# Patient Record
Sex: Female | Born: 1955 | Race: White | Hispanic: No | Marital: Single | Smoking: Former smoker
Health system: Southern US, Community
[De-identification: ages and names within clinical notes are randomized; demographics above are authoritative.]

## PROBLEM LIST (undated history)

## (undated) DIAGNOSIS — G44009 Cluster headache syndrome, unspecified, not intractable: Secondary | ICD-10-CM

## (undated) DIAGNOSIS — E785 Hyperlipidemia, unspecified: Secondary | ICD-10-CM

## (undated) HISTORY — DX: Cluster headache syndrome, unspecified, not intractable: G44.009

## (undated) HISTORY — PX: OTHER SURGICAL HISTORY: SHX169

---

## 2001-11-28 ENCOUNTER — Encounter: Admission: RE | Admit: 2001-11-28 | Discharge: 2001-11-28 | Payer: Self-pay | Admitting: Family Medicine

## 2001-11-28 ENCOUNTER — Encounter: Payer: Self-pay | Admitting: Family Medicine

## 2002-12-06 ENCOUNTER — Encounter: Payer: Self-pay | Admitting: Family Medicine

## 2002-12-06 ENCOUNTER — Encounter: Admission: RE | Admit: 2002-12-06 | Discharge: 2002-12-06 | Payer: Self-pay | Admitting: Family Medicine

## 2004-02-27 ENCOUNTER — Encounter: Admission: RE | Admit: 2004-02-27 | Discharge: 2004-02-27 | Payer: Self-pay | Admitting: Family Medicine

## 2004-09-18 ENCOUNTER — Other Ambulatory Visit: Admission: RE | Admit: 2004-09-18 | Discharge: 2004-09-18 | Payer: Self-pay | Admitting: Family Medicine

## 2005-04-06 HISTORY — PX: ABDOMINAL HYSTERECTOMY: SHX81

## 2005-04-08 ENCOUNTER — Encounter: Admission: RE | Admit: 2005-04-08 | Discharge: 2005-04-08 | Payer: Self-pay | Admitting: Family Medicine

## 2006-04-20 ENCOUNTER — Encounter: Admission: RE | Admit: 2006-04-20 | Discharge: 2006-04-20 | Payer: Self-pay | Admitting: Family Medicine

## 2007-04-22 ENCOUNTER — Encounter: Admission: RE | Admit: 2007-04-22 | Discharge: 2007-04-22 | Payer: Self-pay | Admitting: Family Medicine

## 2008-05-04 ENCOUNTER — Encounter: Admission: RE | Admit: 2008-05-04 | Discharge: 2008-05-04 | Payer: Self-pay | Admitting: Family Medicine

## 2009-06-07 ENCOUNTER — Encounter: Admission: RE | Admit: 2009-06-07 | Discharge: 2009-06-07 | Payer: Self-pay | Admitting: Family Medicine

## 2009-10-23 ENCOUNTER — Encounter: Admission: RE | Admit: 2009-10-23 | Discharge: 2009-10-23 | Payer: Self-pay | Admitting: Family Medicine

## 2009-12-04 ENCOUNTER — Encounter: Admission: RE | Admit: 2009-12-04 | Discharge: 2009-12-04 | Payer: Self-pay | Admitting: Family Medicine

## 2010-05-05 ENCOUNTER — Other Ambulatory Visit: Payer: Self-pay | Admitting: Family Medicine

## 2010-05-05 DIAGNOSIS — Z1239 Encounter for other screening for malignant neoplasm of breast: Secondary | ICD-10-CM

## 2010-06-13 ENCOUNTER — Ambulatory Visit
Admission: RE | Admit: 2010-06-13 | Discharge: 2010-06-13 | Disposition: A | Discharge status: Home / Self Care | Payer: BC Managed Care – PPO | Source: Ambulatory Visit | Attending: Family Medicine | Admitting: Family Medicine

## 2010-06-13 DIAGNOSIS — Z1239 Encounter for other screening for malignant neoplasm of breast: Secondary | ICD-10-CM

## 2011-03-26 ENCOUNTER — Emergency Department
Admission: EM | Admit: 2011-03-26 | Discharge: 2011-03-26 | Disposition: A | Discharge status: Home / Self Care | Payer: BC Managed Care – PPO | Source: Home / Self Care | Attending: Emergency Medicine | Admitting: Emergency Medicine

## 2011-03-26 ENCOUNTER — Encounter: Payer: Self-pay | Admitting: Emergency Medicine

## 2011-03-26 DIAGNOSIS — H9209 Otalgia, unspecified ear: Secondary | ICD-10-CM

## 2011-03-26 DIAGNOSIS — H6092 Unspecified otitis externa, left ear: Secondary | ICD-10-CM

## 2011-03-26 DIAGNOSIS — H9202 Otalgia, left ear: Secondary | ICD-10-CM

## 2011-03-26 DIAGNOSIS — H60399 Other infective otitis externa, unspecified ear: Secondary | ICD-10-CM

## 2011-03-26 MED ORDER — NEOMYCIN-POLYMYXIN-HC 3.5-10000-1 OT SUSP: 4.0000 [drp] | Freq: Three times a day (TID) | OTIC | Status: AC

## 2011-03-26 NOTE — ED Provider Notes (Signed)
History     CSN: [id number]  Arrival date & time 03/26/11  4098   First MD Initiated Contact with Patient 03/26/11 [phone]      Chief Complaint  Patient presents with  . Otalgia    (Consider location/radiation/quality/duration/timing/severity/associated sxs/prior treatment) HPI She is reporting left ear pain for the last 3 days. She is trying to get ear wax there feels that she is irritated the left ear canal. No fever, chills, or any other constitutional symptoms. She does not have any history of ear infections. She is flying in a few days and is concerned that she may have an ear infection.  History reviewed. No pertinent past medical history.  No past surgical history on file.  No family history on file.  History  Substance Use Topics  . Smoking status: Not on file  . Smokeless tobacco: Not on file  . Alcohol Use: Not on file    OB History    Grav Para Term Preterm Abortions TAB SAB Ect Mult Living                  Review of Systems  Allergies  Review of patient's allergies indicates no known allergies.  Home Medications   Current Outpatient Rx  Name Route Sig Dispense Refill  . ATORVASTATIN CALCIUM 20 MG PO TABS Oral Take 20 mg by mouth daily.        BP 137/87  Pulse 79  Temp(Src) 98.2 F (36.8 C) (Oral)  Resp 16  Ht 5\' 2"  (1.575 m)  Wt 140 lb (63.504 kg)  BMI 25.61 kg/m2  SpO2 98%  Physical Exam  Nursing note and vitals reviewed. Constitutional: She is oriented to person, place, and time. She appears well-developed and well-nourished.  HENT:  Head: Normocephalic and atraumatic.  Right Ear: Tympanic membrane, external ear and ear canal normal.  Left Ear: Tympanic membrane and external ear normal.       In the left ear canal there is some irritation posteriorly, mild cerumen, mild swelling.  Eyes: No scleral icterus.  Neck: Neck supple.  Cardiovascular: Regular rhythm and normal heart sounds.   Pulmonary/Chest: Effort normal and breath sounds  normal. No respiratory distress.  Neurological: She is alert and oriented to person, place, and time.  Skin: Skin is warm and dry.  Psychiatric: She has a normal mood and affect. Her speech is normal.    ED Course  Procedures (including critical care time)  Labs Reviewed - No data to display No results found.   No diagnosis found.    MDM   Left otitis externa with some irritation and ear canal. We'll give a prescription for antibiotic eardrops. She is flying in a few days since she can take some Sudafed prior to flying to maintain eustachian tube function. Can also take some ibuprofen or Tylenol for the discomfort. If not improving followup with ENT or her primary care physician.   Lily Kocher, MD 03/26/11 [phone]

## 2011-03-26 NOTE — ED Notes (Signed)
Left ear pain x 3 days.   

## 2011-05-05 ENCOUNTER — Other Ambulatory Visit: Payer: Self-pay | Admitting: Family Medicine

## 2011-05-05 DIAGNOSIS — Z1231 Encounter for screening mammogram for malignant neoplasm of breast: Secondary | ICD-10-CM

## 2011-06-18 ENCOUNTER — Ambulatory Visit
Admission: RE | Admit: 2011-06-18 | Discharge: 2011-06-18 | Disposition: A | Discharge status: Home / Self Care | Payer: BC Managed Care – PPO | Source: Ambulatory Visit | Attending: Family Medicine | Admitting: Family Medicine

## 2011-06-18 DIAGNOSIS — Z1231 Encounter for screening mammogram for malignant neoplasm of breast: Secondary | ICD-10-CM

## 2012-05-24 ENCOUNTER — Other Ambulatory Visit: Payer: Self-pay | Admitting: Family Medicine

## 2012-05-24 DIAGNOSIS — Z1231 Encounter for screening mammogram for malignant neoplasm of breast: Secondary | ICD-10-CM

## 2012-06-24 ENCOUNTER — Ambulatory Visit
Admission: RE | Admit: 2012-06-24 | Discharge: 2012-06-24 | Disposition: A | Discharge status: Home / Self Care | Payer: BC Managed Care – PPO | Source: Ambulatory Visit | Attending: Family Medicine | Admitting: Family Medicine

## 2012-06-24 DIAGNOSIS — Z1231 Encounter for screening mammogram for malignant neoplasm of breast: Secondary | ICD-10-CM

## 2013-06-07 ENCOUNTER — Other Ambulatory Visit: Payer: Self-pay

## 2013-06-07 DIAGNOSIS — Z1231 Encounter for screening mammogram for malignant neoplasm of breast: Secondary | ICD-10-CM

## 2013-07-06 ENCOUNTER — Ambulatory Visit
Admission: RE | Admit: 2013-07-06 | Discharge: 2013-07-06 | Disposition: A | Discharge status: Home / Self Care | Payer: BC Managed Care – PPO | Source: Ambulatory Visit

## 2013-07-06 DIAGNOSIS — Z1231 Encounter for screening mammogram for malignant neoplasm of breast: Secondary | ICD-10-CM

## 2014-07-09 ENCOUNTER — Other Ambulatory Visit: Payer: Self-pay

## 2014-07-09 DIAGNOSIS — Z1231 Encounter for screening mammogram for malignant neoplasm of breast: Secondary | ICD-10-CM

## 2014-07-20 ENCOUNTER — Ambulatory Visit: Payer: BC Managed Care – PPO

## 2014-07-27 ENCOUNTER — Ambulatory Visit
Admission: RE | Admit: 2014-07-27 | Discharge: 2014-07-27 | Disposition: A | Discharge status: Home / Self Care | Payer: BC Managed Care – PPO | Source: Ambulatory Visit

## 2014-07-27 DIAGNOSIS — Z1231 Encounter for screening mammogram for malignant neoplasm of breast: Secondary | ICD-10-CM

## 2014-09-18 ENCOUNTER — Other Ambulatory Visit: Payer: Self-pay | Admitting: Family Medicine

## 2014-09-18 ENCOUNTER — Ambulatory Visit
Admission: RE | Admit: 2014-09-18 | Discharge: 2014-09-18 | Disposition: A | Discharge status: Home / Self Care | Payer: BC Managed Care – PPO | Source: Ambulatory Visit | Attending: Family Medicine | Admitting: Family Medicine

## 2014-09-18 DIAGNOSIS — W19XXXA Unspecified fall, initial encounter: Secondary | ICD-10-CM

## 2014-09-18 DIAGNOSIS — R0781 Pleurodynia: Secondary | ICD-10-CM

## 2015-06-15 ENCOUNTER — Emergency Department
Admission: EM | Admit: 2015-06-15 | Discharge: 2015-06-15 | Disposition: A | Discharge status: Home / Self Care | Payer: BC Managed Care – PPO | Source: Home / Self Care | Attending: Family Medicine | Admitting: Family Medicine

## 2015-06-15 ENCOUNTER — Emergency Department (INDEPENDENT_AMBULATORY_CARE_PROVIDER_SITE_OTHER): Payer: BC Managed Care – PPO

## 2015-06-15 ENCOUNTER — Encounter: Payer: Self-pay | Admitting: Emergency Medicine

## 2015-06-15 DIAGNOSIS — S61412A Laceration without foreign body of left hand, initial encounter: Secondary | ICD-10-CM

## 2015-06-15 DIAGNOSIS — Z23 Encounter for immunization: Secondary | ICD-10-CM | POA: Diagnosis not present

## 2015-06-15 DIAGNOSIS — R030 Elevated blood-pressure reading, without diagnosis of hypertension: Secondary | ICD-10-CM | POA: Diagnosis not present

## 2015-06-15 DIAGNOSIS — X58XXXA Exposure to other specified factors, initial encounter: Secondary | ICD-10-CM

## 2015-06-15 DIAGNOSIS — IMO0001 Reserved for inherently not codable concepts without codable children: Secondary | ICD-10-CM

## 2015-06-15 HISTORY — DX: Hyperlipidemia, unspecified: E78.5

## 2015-06-15 MED ORDER — TETANUS-DIPHTH-ACELL PERTUSSIS 5-2.5-18.5 LF-MCG/0.5 IM SUSP
0.5000 mL | Freq: Once | INTRAMUSCULAR | Status: AC
  Administered 2015-06-15: 0.5 mL via INTRAMUSCULAR

## 2015-06-15 MED ORDER — TRAMADOL HCL 50 MG PO TABS
50.0000 mg | ORAL_TABLET | Freq: Four times a day (QID) | ORAL | Status: DC | PRN
Start: 2015-06-15 — End: 2015-12-20

## 2015-06-15 MED ORDER — CEPHALEXIN 500 MG PO CAPS: 500.0000 mg | ORAL_CAPSULE | Freq: Two times a day (BID) | ORAL | Status: DC

## 2015-06-15 NOTE — ED Provider Notes (Signed)
CSN: [id number]     Arrival date & time 06/15/15  1649 History   First MD Initiated Contact with Patient 06/15/15 1751     Chief Complaint  Patient presents with  . Extremity Laceration   (Consider location/radiation/quality/duration/timing/severity/associated sxs/prior Treatment) HPI  [facility] with laceration to her Left hand, palm side, that occurred about 2 hours PTA.  Pt states she was using the back end of an axe to split wood when the axe slipped and hit her hand. Bleeding controlled PTA.  Pt is not on blood thinners. Right hand dominant.  Pain is aching and stinging, worse with movement, 3/10.   BP elevated in triage. She states she has been informed a few times that her BP is trending up and plans to discuss possible medication treatment with her PCP during her next physical exam in a few months. Denies headache, dizziness, chest pain or SOB.    Past Medical History  Diagnosis Date  . Hyperlipemia    Past Surgical History  Procedure Laterality Date  . Abdominal hysterectomy     History reviewed. No pertinent family history. Social History  Substance Use Topics  . Smoking status: Former Games developermoker  . Smokeless tobacco: None  . Alcohol Use: Yes   OB History    No data available     Review of Systems  Musculoskeletal: Positive for myalgias and arthralgias. Negative for joint swelling.       Left hand  Skin: Positive for wound. Negative for color change.  Neurological: Negative for weakness and numbness.    Allergies  Demerol  Home Medications   Prior to Admission medications   Medication Sig Start Date End Date Taking? Authorizing Provider  atorvastatin (LIPITOR) 20 MG tablet Take 20 mg by mouth daily.      Historical Provider, MD  cephALEXin (KEFLEX) 500 MG capsule Take 1 capsule (500 mg total) by mouth 2 (two) times daily. For 7 days 06/15/15   Junius FinnerErin O'Malley, PA-C  traMADol (ULTRAM) 50 MG tablet Take 1 tablet (50 mg total) by mouth every 6 (six) hours as  needed. 06/15/15   Junius FinnerErin O'Malley, PA-C   Meds Ordered and Administered this Visit   Medications  Tdap (BOOSTRIX) injection 0.5 mL (0.5 mLs Intramuscular Given 06/15/15 1832)    BP 159/111 mmHg  Pulse 79  Temp(Src) 97.6 F (36.4 C) (Oral)  Wt 145 lb (65.772 kg)  SpO2 100% No data found.   Physical Exam  Constitutional: She is oriented to person, place, and time. She appears well-developed and well-nourished.  HENT:  Head: Normocephalic and atraumatic.  Eyes: EOM are normal.  Neck: Normal range of motion.  Cardiovascular: Normal rate.   Left hand: cap refill < 3 seconds  Pulmonary/Chest: Effort normal.  Musculoskeletal: Normal range of motion. She exhibits tenderness. She exhibits no edema.  Left hand: slight decreased flexion of thumb due to wound (see skin exam) full ROM all other fingers.   Neurological: She is alert and oriented to person, place, and time.  Left hand: normal sensation compared to Right hand.  Skin: Skin is warm and dry.  Left hand, palmar aspect: 3cm triangular shaped laceration. Bleeding controlled. Scant amount of debris visualized in wound.   Psychiatric: She has a normal mood and affect. Her behavior is normal.  Nursing note and vitals reviewed.   ED Course  .Marland Kitchen.Laceration Repair Date/Time: 06/15/2015 6:55 PM Performed by: Junius Finner'MALLEY, Mozell Hardacre Authorized by: Donna ChristenBEESE, STEPHEN A Consent: Verbal consent obtained. Risks and benefits: risks, benefits  and alternatives were discussed Consent given by: patient Patient understanding: patient states understanding of the procedure being performed Patient consent: the patient's understanding of the procedure matches consent given Site marked: the operative site was marked Imaging studies: imaging studies available Required items: required blood products, implants, devices, and special equipment available Patient identity confirmed: verbally with patient Time out: Immediately prior to procedure a "time out" was called  to verify the correct patient, procedure, equipment, support staff and site/side marked as required. Body area: upper extremity Location details: left hand Laceration length: 3 cm Contamination: The wound is contaminated. Foreign bodies: wood Tendon involvement: none Nerve involvement: none Vascular damage: no Anesthesia: local infiltration Local anesthetic: lidocaine 2% without epinephrine and topical anesthetic Anesthetic total: 2 ml Patient sedated: no Preparation: Patient was prepped and draped in the usual sterile fashion. Irrigation solution: saline Irrigation method: syringe Amount of cleaning: extensive Debridement: none Degree of undermining: none Skin closure: 5-0 Prolene Number of sutures: 5 Technique: simple Approximation: close Approximation difficulty: simple Dressing: 4x4 sterile gauze and antibiotic ointment Patient tolerance: Patient tolerated the procedure well with no immediate complications   (including critical care time)  Labs Review Labs Reviewed - No data to display  Imaging Review Dg Hand Complete Left  06/15/2015  CLINICAL DATA:  Left hand laceration. EXAM: LEFT HAND - COMPLETE 3+ VIEW COMPARISON:  None. FINDINGS: There is no evidence of fracture or dislocation. There is no evidence of arthropathy or other focal bone abnormality. Soft tissues are unremarkable. IMPRESSION: No fracture. No evidence for retained radiopaque soft tissue foreign body. Electronically Signed   By: Kennith Center M.D.   On: 06/15/2015 18:24      MDM   1. Hand laceration, left, initial encounter   2. Elevated blood pressure    Pt presenting to [facility] with laceration to her Left hand.    Plain films: no fracture or radiopaque soft tissue foreign bodies seen.  Wound thoroughly irrigated and sutured closed, then bandaged. Tdap updated here.  Home care instructions provided. F/u in 10-14 days for suture removal. Rx: keflex and tramadol.  May also take acetaminophen or  ibuprofen for pain. Reminded to f/u with PCP about her BP.   Patient verbalized understanding and agreement with treatment plan.      Junius Finner, PA-C 06/16/15 1101

## 2015-06-15 NOTE — ED Notes (Signed)
Pt states she cut her left hand while spliting wood about 2 hours ago. Unsure of last tetanus.

## 2015-06-15 NOTE — ED Notes (Signed)
Applied dressing

## 2015-07-22 ENCOUNTER — Other Ambulatory Visit: Payer: Self-pay

## 2015-07-22 DIAGNOSIS — Z1231 Encounter for screening mammogram for malignant neoplasm of breast: Secondary | ICD-10-CM

## 2015-08-09 ENCOUNTER — Ambulatory Visit
Admission: RE | Admit: 2015-08-09 | Discharge: 2015-08-09 | Disposition: A | Discharge status: Home / Self Care | Payer: BC Managed Care – PPO | Source: Ambulatory Visit

## 2015-08-09 DIAGNOSIS — Z1231 Encounter for screening mammogram for malignant neoplasm of breast: Secondary | ICD-10-CM

## 2015-12-17 ENCOUNTER — Ambulatory Visit: Payer: BC Managed Care – PPO | Admitting: Neurology

## 2015-12-20 ENCOUNTER — Ambulatory Visit (INDEPENDENT_AMBULATORY_CARE_PROVIDER_SITE_OTHER): Payer: BC Managed Care – PPO | Admitting: Neurology

## 2015-12-20 ENCOUNTER — Telehealth: Payer: Self-pay | Admitting: *Deleted

## 2015-12-20 ENCOUNTER — Encounter: Payer: Self-pay | Admitting: Neurology

## 2015-12-20 VITALS — BP 145/87 | HR 68 | Ht 62.0 in | Wt 140.8 lb

## 2015-12-20 DIAGNOSIS — G44019 Episodic cluster headache, not intractable: Secondary | ICD-10-CM | POA: Diagnosis not present

## 2015-12-20 MED ORDER — SUMATRIPTAN SUCCINATE 100 MG PO TABS: 100.0000 mg | ORAL_TABLET | Freq: Once | ORAL | 12 refills | Status: AC | PRN

## 2015-12-20 NOTE — Telephone Encounter (Signed)
[facility] and called in lidocaine nasal spray 4%. Instructions 1-2 sprays in affected nostril as needed every 1 hour for cluster headaches. Advised per Dr Lucia GaskinsAhern 11 refills. Gave patient name and DOB, and phone number. She will contact patient once ready to pick up.

## 2015-12-20 NOTE — Patient Instructions (Signed)
Overall you are doing fairly well but I do want to suggest a few things today:   Remember to drink plenty of fluid, eat healthy meals and do not skip any meals. Try to eat protein with a every meal and eat a healthy snack such as fruit or nuts in between meals. Try to keep a regular sleep-wake schedule and try to exercise daily, particularly in the form of walking, 20-30 minutes a day, if you can.   As far as your medications are concerned, I would like to suggest At onset of cluster headache: Oxygen 7-10 L/min no more than 12L/min for 15 minutes Lidocaine nose spray every hour as needed Imitrex at onset can repeat in 2 hours once if needed. No more than 2x in a day.  I would like to see you back as needed, sooner if we need to. Please call us with any interim questions, concerns, problems, updates or refill requests.   Our phone number is [phone]. We also have an after hours call service for urgent matters and there is a physician on-call for urgent questions. For any emergencies you know to call 911 or go to the nearest emergency room  Sumatriptan tablets What is this medicine? SUMATRIPTAN (soo ma TRIP tan) is used to treat migraines with or without aura. An aura is a strange feeling or visual disturbance that warns you of an attack. It is not used to prevent migraines. This medicine may be used for other purposes; ask your health care provider or pharmacist if you have questions. What should I tell my health care provider before I take this medicine? They need to know if you have any of these conditions: -circulation problems in fingers and toes -diabetes -heart disease -high blood pressure -high cholesterol -history of irregular heartbeat -history of stroke -kidney disease -liver disease -postmenopausal or surgical removal of uterus and ovaries -seizures -smoke tobacco -stomach or intestine problems -an unusual or allergic reaction to sumatriptan, other medicines, foods,  dyes, or preservatives -pregnant or trying to get pregnant -breast-feeding How should I use this medicine? Take this medicine by mouth with a glass of water. Follow the directions on the prescription label. This medicine is taken at the first symptoms of a migraine. It is not for everyday use. If your migraine headache returns after one dose, you can take another dose as directed. You must leave at least 2 hours between doses, and do not take more than 100 mg as a single dose. Do not take more than 200 mg total in any 24 hour period. If there is no improvement at all after the first dose, do not take a second dose without talking to your doctor or health care professional. Do not take your medicine more often than directed. Talk to your pediatrician regarding the use of this medicine in children. Special care may be needed. Overdosage: If you think you have taken too much of this medicine contact a poison control center or emergency room at once. NOTE: This medicine is only for you. Do not share this medicine with others. What if I miss a dose? This does not apply; this medicine is not for regular use. What may interact with this medicine? Do not take this medicine with any of the following medicines: -cocaine -ergot alkaloids like dihydroergotamine, ergonovine, ergotamine, methylergonovine -feverfew -MAOIs like Carbex, Eldepryl, Marplan, Nardil, and Parnate -other medicines for migraine headache like almotriptan, eletriptan, frovatriptan, naratriptan, rizatriptan, zolmitriptan -tryptophan This medicine may also interact with the following medications: -  certain medicines for depression, anxiety, or psychotic disturbances This list may not describe all possible interactions. Give your health care provider a list of all the medicines, herbs, non-prescription drugs, or dietary supplements you use. Also tell them if you smoke, drink alcohol, or use illegal drugs. Some items may interact with your  medicine. What should I watch for while using this medicine? Only take this medicine for a migraine headache. Take it if you get warning symptoms or at the start of a migraine attack. It is not for regular use to prevent migraine attacks. You may get drowsy or dizzy. Do not drive, use machinery, or do anything that needs mental alertness until you know how this medicine affects you. To reduce dizzy or fainting spells, do not sit or stand up quickly, especially if you are an older patient. Alcohol can increase drowsiness, dizziness and flushing. Avoid alcoholic drinks. Smoking cigarettes may increase the risk of heart-related side effects from using this medicine. If you take migraine medicines for 10 or more days a month, your migraines may get worse. Keep a diary of headache days and medicine use. Contact your healthcare professional if your migraine attacks occur more frequently. What side effects may I notice from receiving this medicine? Side effects that you should report to your doctor or health care professional as soon as possible: -allergic reactions like skin rash, itching or hives, swelling of the face, lips, or tongue -bloody or watery diarrhea -hallucination, loss of contact with reality -pain, tingling, numbness in the face, hands, or feet -seizures -signs and symptoms of a blood clot such as breathing problems; changes in vision; chest pain; severe, sudden headache; pain, swelling, warmth in the leg; trouble speaking; sudden numbness or weakness of the face, arm, or leg -signs and symptoms of a dangerous change in heartbeat or heart rhythm like chest pain; dizziness; fast or irregular heartbeat; palpitations, feeling faint or lightheaded; falls; breathing problems -signs and symptoms of a stroke like changes in vision; confusion; trouble speaking or understanding; severe headaches; sudden numbness or weakness of the face, arm, or leg; trouble walking; dizziness; loss of balance or  coordination -stomach pain Side effects that usually do not require medical attention (report these to your doctor or health care professional if they continue or are bothersome): -changes in taste -facial flushing -headache -muscle cramps -muscle pain -nausea, vomiting -weak or tired This list may not describe all possible side effects. Call your doctor for medical advice about side effects. You may report side effects to FDA at 1-800-FDA-1088. Where should I keep my medicine? Keep out of the reach of children. Store at room temperature between 2 and 30 degrees C (36 and 86 degrees F). Throw away any unused medicine after the expiration date. NOTE: This sheet is a summary. It may not cover all possible information. If you have questions about this medicine, talk to your doctor, pharmacist, or health care provider.    2016, [employer]vier/[employer] Standard. (2014-09-27 17:46:40)

## 2015-12-20 NOTE — Progress Notes (Signed)
Faxed order to [facility] for oxygen. Instructions: At onset of headache, 100% oxygen at 7-10LPM, max 12 LPM for up to 15 min. May repeat three times per day. Fax: [phone]. Received confirmaiton.

## 2015-12-20 NOTE — Progress Notes (Signed)
[facility]    Provider:  Dr Lucia Gaskins Referring Provider: Sigmund Hazel, MD Primary Care Physician:  Neldon Labella, MD  CC:  Cluster headaches  HPI:  Angela Pruitt is a 60 y.o. female here as a referral from Dr. Hyacinth Meeker for cluster headache. Past medical history of episodic cluster headaches, hypercholesterolemia, overweight. Started around 2010-2011. She thought she had an abscess tooth when the symptoms started but having it out did not help. No inciting events or head trauma. Headaches always on the left, a boring searing, squeezing pain. Pain can be radiating into the cheek. It feels like someone is trying to poke her eye out. It gradually comes on between 10pm and 1130 at night. Lately she has been having them more, increased since July 8 in July, 8 in August and likely that may in September. She did not want to take tegretol as was suggested. Her eye will water and her nose is tender during the headache. Lasts 30 minutes to an hour, average 45 minutes. Paroxysmal, no triggers. No other associated factors or symptoms. No trauma to the left side of the head or inciting events that she knows. Recently she went for 10 months with none and recently they came back. Maybe she has more of them in the fall but unclear. They can be severe. No nausea or vomiting. No FHx of clusters but mother has migraines.She has found nothing to make them better.   Reviewed notes, labs and imaging from outside physicians, which showed:  BUN 13, creatinine 0.13 December 2015 TSH 1.05 June 2014 hemoglobin A1c 5. 11/10/2013  MRI brain 2011: report only  Findings: Faint enhancement in the right posterior putamen is unchanged.  This measures approximately 7 mm in size.  No significant T2 or FLAIR abnormality in this area.  No surrounding edema.  No diffusion restriction is present.   Small venous angioma on the left frontal lobe is unchanged.   The ventricles are normal.  The white matter is  normal.  Brainstem is normal.  No intracranial hemorrhage.   IMPRESSION: Subtle enhancement in the right posterior putamen is unchanged.  I believe this rules out subacute infarct and demyelinating disease. 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 No underlying mass lesion is identified.   Small venous angioma the left frontal lobe.   No acute infarct is present.  Reviewed primary care notes from [facility] physicians. Patient has a history of tobacco use quit in 2007. She's had total abdominal hysterectomy in 2007 with bilateral oophorectomy for tubo-ovarian abscess. Patient was seen for facial pain as far back as as July 2011. She reported left-sided facial pain with severe attacks. She does not want to start Tegretol due to concerns about side effects. MRI was ordered as above. Ice pack and taking aspirin for the attacks seem to help her. She was diagnosed with trigeminal neuralgia. The pain starts in 2011 and September. Described as a pulsating searing pain starts at the upper jaw and radiates to the ear, cheek, behind her eye. She thought it was a dental issue but had tooth extracted in June 2011 and the pain continued. Denied headache, visual disturbances, numbness, tingling in the face. Patient reported pain up to 7 out of 10 in severity. Patient continued to have the symptoms in clusters off and on. Exam was normal including general, head, eyes, ears, nose, throat, neck, chest, heart, breast, abdomen, extremities and skin. I did not see any other treatment other than that above mentioned in the  notes.   Review of Systems: Patient complains of symptoms per HPI as well as the following symptoms: allergies. Pertinent negatives per HPI. All others negative.   Social History   Social History  . Marital status: Single    Spouse name: N/A  . Number of children: 0  . Years of education: 12   Occupational History  . [employer]- Education officer, environmental)    Social History Main Topics  .  Smoking status: Former Smoker    Quit date: 04/06/2005  . Smokeless tobacco: Never Used  . Alcohol use Yes     Comment: 2-4 oz per week  . Drug use: No  . Sexual activity: Not on file   Other Topics Concern  . Not on file   Social History Narrative   Lives with Elnita [employer] seeds   Caffeine use: 2 cups coffee/day    Family History  Problem Relation Age of Onset  . Diabetes type II Mother   . Heart Problems Mother   . Esophageal cancer Father     Past Medical History:  Diagnosis Date  . Cluster headache   . Hyperlipemia     Past Surgical History:  Procedure Laterality Date  . ABDOMINAL HYSTERECTOMY  2007  . ACl tear  1980's   Knee    Current Outpatient Prescriptions  Medication Sig Dispense Refill  . atorvastatin (LIPITOR) 20 MG tablet Take 20 mg by mouth daily.      . fluticasone (FLONASE) 50 MCG/ACT nasal spray Place 1 spray into both nostrils daily.    Marland Kitchen PREMARIN vaginal cream 1 Dose. Every 4 days    . PRESCRIPTION MEDICATION Place 1 spray into the nose. lidocaine nasal spray 4%. Instructions 1-2 sprays in affected nostril as needed every 1 hour for cluster headaches.    . SUMAtriptan (IMITREX) 100 MG tablet Take 1 tablet (100 mg total) by mouth once as needed. May repeat in 2 hours if headache persists or recurs. 10 tablet 12   No current facility-administered medications for this visit.     Allergies as of 12/20/2015 - Review Complete 12/20/2015  Allergen Reaction Noted  . Demerol [meperidine]  06/15/2015    Vitals: BP (!) 145/87 (BP Location: Right Arm, Patient Position: Sitting, Cuff Size: Normal)   Pulse 68   Ht 5\' 2"  (1.575 m)   Wt 140 lb 12.8 oz (63.9 kg)   BMI 25.75 kg/m  Last Weight:  Wt Readings from Last 1 Encounters:  12/20/15 140 lb 12.8 oz (63.9 kg)   Last Height:   Ht Readings from Last 1 Encounters:  12/20/15 5\' 2"  (1.575 m)    Physical exam: Exam: Gen: NAD, conversant, well nourised, well groomed                     CV: RRR, no MRG. No  Carotid Bruits. No peripheral edema, warm, nontender Eyes: Conjunctivae clear without exudates or hemorrhage  Neuro: Detailed Neurologic Exam  Speech:    Speech is normal; fluent and spontaneous with normal comprehension.  Cognition:    The patient is oriented to person, place, and time;     recent and remote memory intact;     language fluent;     normal attention, concentration,     fund of knowledge Cranial Nerves:    The pupils are equal, round, and reactive to light. The fundi are normal and spontaneous venous pulsations are present. Visual fields are full to finger confrontation. Extraocular movements are intact. Trigeminal sensation is  intact and the muscles of mastication are normal. The face is symmetric. The palate elevates in the midline. Hearing intact. Voice is normal. Shoulder shrug is normal. The tongue has normal motion without fasciculations.   Coordination:    Normal finger to nose and heel to shin. Normal rapid alternating movements.   Gait:    Heel-toe and tandem gait are normal.   Motor Observation:    No asymmetry, no atrophy, and no involuntary movements noted. Tone:    Normal muscle tone.    Posture:    Posture is normal. normal erect    Strength:    Strength is V/V in the upper and lower limbs.      Sensation: intact to LT     Reflex Exam:  DTR's:    Deep tendon reflexes in the upper and lower extremities are normal bilaterally.   Toes:    The toes are downgoing bilaterally.   Clonus:    Clonus is absent.      Assessment/Plan:  60 year old female with episodic cluster headaches. Discussed cluster headaches in detail. Discussed medications such as verapamil is what I would suggest she try first if she wanted to try preventative. At this time patient just wants to try acute management. Discussed lidocaine, triptan pill and nose spray and using oxygen. We'll try to order all the above. Neurologic exam nonfocal.  Remember to drink plenty of  fluid, eat healthy meals and do not skip any meals. Try to eat protein with a every meal and eat a healthy snack such as fruit or nuts in between meals. Try to keep a regular sleep-wake schedule and try to exercise daily, particularly in the form of walking, 20-30 minutes a day, if you can.   As far as your medications are concerned, I would like to suggest At onset of cluster headache: Oxygen 7-10 L/min no more than 12L/min for 15 minutes Lidocaine nose spray every hour as needed unaffected side Imitrex at onset can repeat in 2 hours once if needed. No more than 2x in a day. If the pill does not work would recommend nasal spray, imitrex injections which may be the best.  Discussed side effects of the medications as per instructions.  Discussed:To prevent or relieve headaches, try the following: Cool Compress. Lie down and place a cool compress on your head.  Avoid headache triggers. If certain foods or odors seem to have triggered your migraines in the past, avoid them. A headache diary might help you identify triggers.  Include physical activity in your daily routine. Try a daily walk or other moderate aerobic exercise.  Manage stress. Find healthy ways to cope with the stressors, such as delegating tasks on your to-do list.  Practice relaxation techniques. Try deep breathing, yoga, massage and visualization.  Eat regularly. Eating regularly scheduled meals and maintaining a healthy diet might help prevent headaches. Also, drink plenty of fluids.  Follow a regular sleep schedule. Sleep deprivation might contribute to headaches Consider biofeedback. With this mind-body technique, you learn to control certain bodily functions - such as muscle tension, heart rate and blood pressure - to prevent headaches or reduce headache pain.    Proceed to emergency room if you experience new or worsening symptoms or symptoms do not resolve, if you have new neurologic symptoms or if headache is severe, or for  any concerning symptom.   Cc: Neldon LabellaMILLER,LISA LYNN, MD  Naomie DeanAntonia Gisselle Galvis, MD  [facility] [address] [place], [place] [id number]  Phone  [phone] Fax [phone]

## 2016-08-06 ENCOUNTER — Other Ambulatory Visit: Payer: Self-pay | Admitting: Family Medicine

## 2016-08-06 DIAGNOSIS — Z1231 Encounter for screening mammogram for malignant neoplasm of breast: Secondary | ICD-10-CM

## 2016-08-28 ENCOUNTER — Ambulatory Visit: Payer: BC Managed Care – PPO

## 2016-09-09 ENCOUNTER — Ambulatory Visit
Admission: RE | Admit: 2016-09-09 | Discharge: 2016-09-09 | Disposition: A | Discharge status: Home / Self Care | Payer: BC Managed Care – PPO | Source: Ambulatory Visit | Attending: Family Medicine | Admitting: Family Medicine

## 2016-09-09 DIAGNOSIS — Z1231 Encounter for screening mammogram for malignant neoplasm of breast: Secondary | ICD-10-CM

## 2017-08-11 ENCOUNTER — Other Ambulatory Visit: Payer: Self-pay | Admitting: Family Medicine

## 2017-08-11 DIAGNOSIS — Z1231 Encounter for screening mammogram for malignant neoplasm of breast: Secondary | ICD-10-CM

## 2017-09-17 ENCOUNTER — Ambulatory Visit
Admission: RE | Admit: 2017-09-17 | Discharge: 2017-09-17 | Disposition: A | Discharge status: Home / Self Care | Payer: BC Managed Care – PPO | Source: Ambulatory Visit | Attending: Family Medicine | Admitting: Family Medicine

## 2017-09-17 DIAGNOSIS — Z1231 Encounter for screening mammogram for malignant neoplasm of breast: Secondary | ICD-10-CM

## 2018-10-31 ENCOUNTER — Other Ambulatory Visit: Payer: Self-pay | Admitting: Family Medicine

## 2018-10-31 DIAGNOSIS — Z1231 Encounter for screening mammogram for malignant neoplasm of breast: Secondary | ICD-10-CM

## 2018-12-19 ENCOUNTER — Ambulatory Visit
Admission: RE | Admit: 2018-12-19 | Discharge: 2018-12-19 | Disposition: A | Discharge status: Home / Self Care | Payer: BC Managed Care – PPO | Source: Ambulatory Visit | Attending: Family Medicine | Admitting: Family Medicine

## 2018-12-19 ENCOUNTER — Other Ambulatory Visit: Payer: Self-pay

## 2018-12-19 DIAGNOSIS — Z1231 Encounter for screening mammogram for malignant neoplasm of breast: Secondary | ICD-10-CM

## 2019-06-09 ENCOUNTER — Ambulatory Visit: Payer: BC Managed Care – PPO | Attending: Internal Medicine

## 2019-06-09 DIAGNOSIS — Z23 Encounter for immunization: Secondary | ICD-10-CM

## 2019-06-09 NOTE — Progress Notes (Signed)
   Covid-19 Vaccination Clinic  Name:  MARIYAM REMINGTON    MRN: [id number] DOB: 04-May-1955  06/09/2019  Ms. Rumore was observed post Covid-19 immunization for 15 minutes without incident. She was provided with Vaccine Information Sheet and instruction to access the V-Safe system.   Ms. Tech was instructed to call 911 with any severe reactions post vaccine: Marland Kitchen Difficulty breathing  . Swelling of face and throat  . A fast heartbeat  . A bad rash all over body  . Dizziness and weakness

## 2019-07-05 ENCOUNTER — Ambulatory Visit: Payer: BC Managed Care – PPO | Attending: Internal Medicine

## 2019-07-05 DIAGNOSIS — Z23 Encounter for immunization: Secondary | ICD-10-CM

## 2019-07-05 NOTE — Progress Notes (Signed)
   Covid-19 Vaccination Clinic  Name:  ZARAHI FUERST    MRN: [id number] DOB: 07-Dec-1955  07/05/2019  Ms. Sandell was observed post Covid-19 immunization for 15 minutes without incident. She was provided with Vaccine Information Sheet and instruction to access the V-Safe system.   Ms. Kellogg was instructed to call 911 with any severe reactions post vaccine: Marland Kitchen Difficulty breathing  . Swelling of face and throat  . A fast heartbeat  . A bad rash all over body  . Dizziness and weakness   Immunizations Administered    Name Date Dose VIS Date Route   Pfizer COVID-19 Vaccine 07/05/2019 12:56 PM 0.3 mL 03/17/2019 Intramuscular   Manufacturer: [employer], [employer]   Lot: [id number]   NDC: [address]-4

## 2019-10-20 ENCOUNTER — Ambulatory Visit: Payer: BC Managed Care – PPO | Admitting: Family Medicine

## 2019-10-20 ENCOUNTER — Encounter: Payer: Self-pay | Admitting: Family Medicine

## 2019-10-20 ENCOUNTER — Ambulatory Visit: Payer: Self-pay

## 2019-10-20 ENCOUNTER — Other Ambulatory Visit: Payer: Self-pay

## 2019-10-20 DIAGNOSIS — M25562 Pain in left knee: Secondary | ICD-10-CM | POA: Diagnosis not present

## 2019-10-20 NOTE — Progress Notes (Signed)
Office Visit Note   Patient: Angela Pruitt           Date of Birth: 1956-03-26           MRN: [id number] Visit Date: 10/20/2019 Requested by: Sigmund Hazel, MD [address] [place],  [place] [address] PCP: Sigmund Hazel, MD  Subjective: Chief Complaint  Patient presents with  . Left Knee - Pain    Pain x 7 weeks. Fell on 09/03/19, after "stepping wrong." Then got worse after she squatted. Iced knee and kept weight off it and it got better. Still has pain inferior to the patella and medial aspect. Tightness med/lat. H/o ACL tear 1988 (scope).    HPI: She is here with left knee pain.  About 7 weeks ago she stepped awkwardly out of her car, her knee shifted and she fell.  She has had pain and tightness on the medial aspect since then.  She is status post ACL tear in 1988 and underwent arthroscopic debridement rather than reconstruction.  She has done well since then with only occasional sensation of instability.  Since this injury, her knee feels stiff and has been swollen.  She has pain when she pivots.  She has been using Advil with some improvement.  She has pain when she tries to walk longer distances such as when walking from the parking lot to her job at [employer].               ROS:   All other systems were reviewed and are negative.  Objective: Vital Signs: There were no vitals taken for this visit.  Physical Exam:  General:  Alert and oriented, in no acute distress. Pulm:  Breathing unlabored. Psy:  Normal mood, congruent affect.  Left knee: Trace patellofemoral crepitus.  1+ effusion with no warmth.  No significant pain with patella compression or apprehension.  She has 1+ laxity with anterior drawer.  No laxity with varus or valgus stress.  Very tender on the medial joint line with pain but no definite click with McMurray's.  Imaging: XR Knee 1-2 Views Left  Result Date: 10/20/2019 X-rays left knee reveal mild to moderate narrowing of the medial and lateral compartments  and spurring in the patellofemoral joint.  No sign of loose body or fracture.   Assessment & Plan: 1.  Left knee pain, suspicious for medial meniscus tear. -She wants to try a hinged knee brace, leg exercises at home.  Glucosamine and turmeric. -Consider MRI scan if symptoms persist.  Could also contemplate a one-time cortisone injection.     Procedures: No procedures performed  No notes on file     PMFS History: Patient Active Problem List   Diagnosis Date Noted  . Episodic cluster headache 12/20/2015   Past Medical History:  Diagnosis Date  . Cluster headache   . Hyperlipemia     Family History  Problem Relation Age of Onset  . Diabetes type II Mother   . Heart Problems Mother   . Esophageal cancer Father   . Breast cancer Maternal Aunt     Past Surgical History:  Procedure Laterality Date  . ABDOMINAL HYSTERECTOMY  2007  . ACl tear  1980's   Knee   Social History   Occupational History  . 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 Smoking status: Former Smoker    Quit date: 04/06/2005    Years since quitting: 14.5  . Smokeless tobacco: Never Used  Substance and Sexual Activity  .  Alcohol use: Yes    Comment: 2-4 oz per week  . Drug use: No  . Sexual activity: Not on file

## 2019-11-21 ENCOUNTER — Other Ambulatory Visit: Payer: Self-pay | Admitting: Family Medicine

## 2019-11-21 DIAGNOSIS — Z1231 Encounter for screening mammogram for malignant neoplasm of breast: Secondary | ICD-10-CM

## 2020-01-02 ENCOUNTER — Other Ambulatory Visit: Payer: Self-pay

## 2020-01-02 ENCOUNTER — Ambulatory Visit
Admission: RE | Admit: 2020-01-02 | Discharge: 2020-01-02 | Disposition: A | Discharge status: Home / Self Care | Payer: BC Managed Care – PPO | Source: Ambulatory Visit

## 2020-01-02 DIAGNOSIS — Z1231 Encounter for screening mammogram for malignant neoplasm of breast: Secondary | ICD-10-CM

## 2020-11-26 ENCOUNTER — Other Ambulatory Visit: Payer: Self-pay | Admitting: Family Medicine

## 2020-11-26 DIAGNOSIS — Z1231 Encounter for screening mammogram for malignant neoplasm of breast: Secondary | ICD-10-CM

## 2021-01-09 ENCOUNTER — Ambulatory Visit
Admission: RE | Admit: 2021-01-09 | Discharge: 2021-01-09 | Disposition: A | Discharge status: Home / Self Care | Payer: BC Managed Care – PPO | Source: Ambulatory Visit | Attending: Family Medicine | Admitting: Family Medicine

## 2021-01-09 ENCOUNTER — Other Ambulatory Visit: Payer: Self-pay

## 2021-01-09 DIAGNOSIS — Z1231 Encounter for screening mammogram for malignant neoplasm of breast: Secondary | ICD-10-CM

## 2021-10-17 IMAGING — MG MM DIGITAL SCREENING BILAT W/ TOMO AND CAD
6 of 10 series · 6 of 30 positions shown · non-contrast
Comparison: Previous exam(s).

CLINICAL DATA: Screening.

EXAM:
DIGITAL SCREENING BILATERAL MAMMOGRAM WITH TOMOSYNTHESIS AND CAD
TECHNIQUE: Bilateral screening digital craniocaudal and mediolateral oblique
mammograms were obtained. Bilateral screening digital breast
tomosynthesis was performed. The images were evaluated with
computer-aided detection.

[R CC synth-2D]
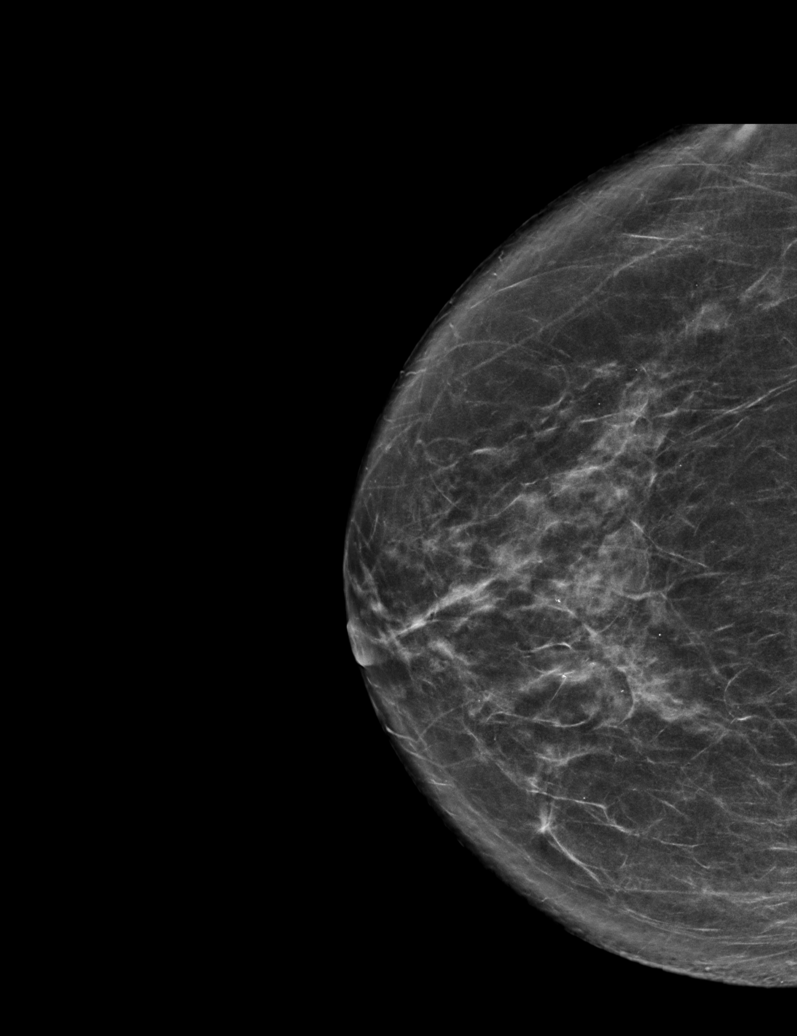

[L MLO synth-2D (1 of 2)]
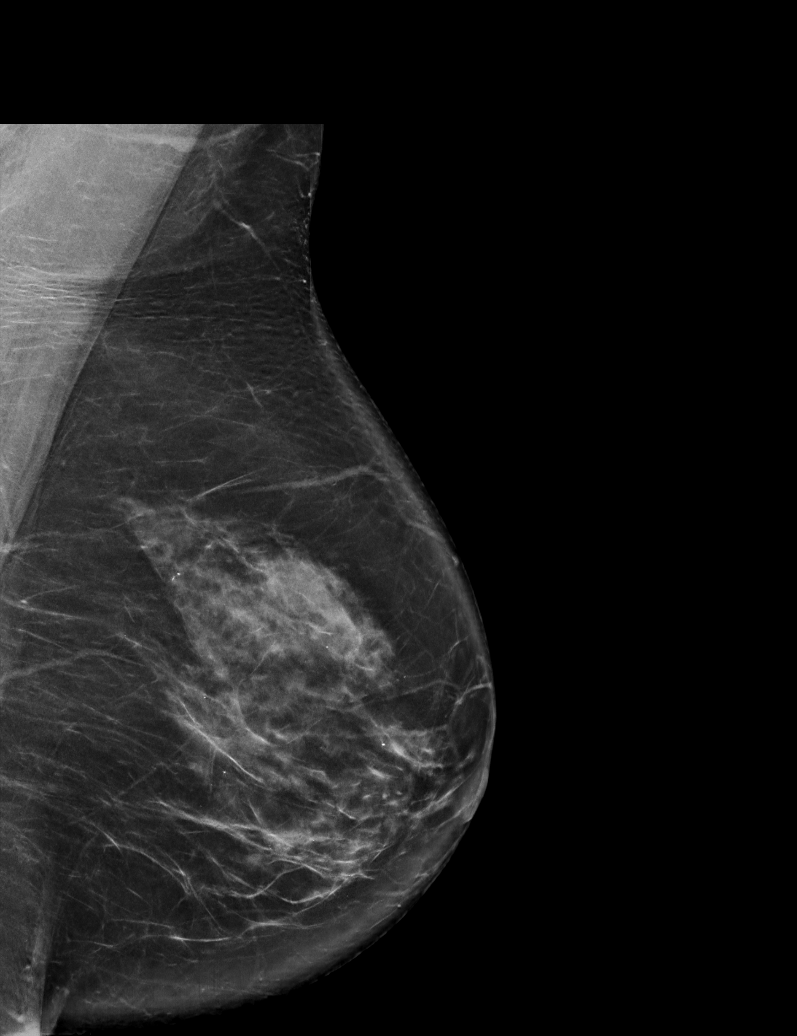

[L MLO synth-2D (2 of 2)]
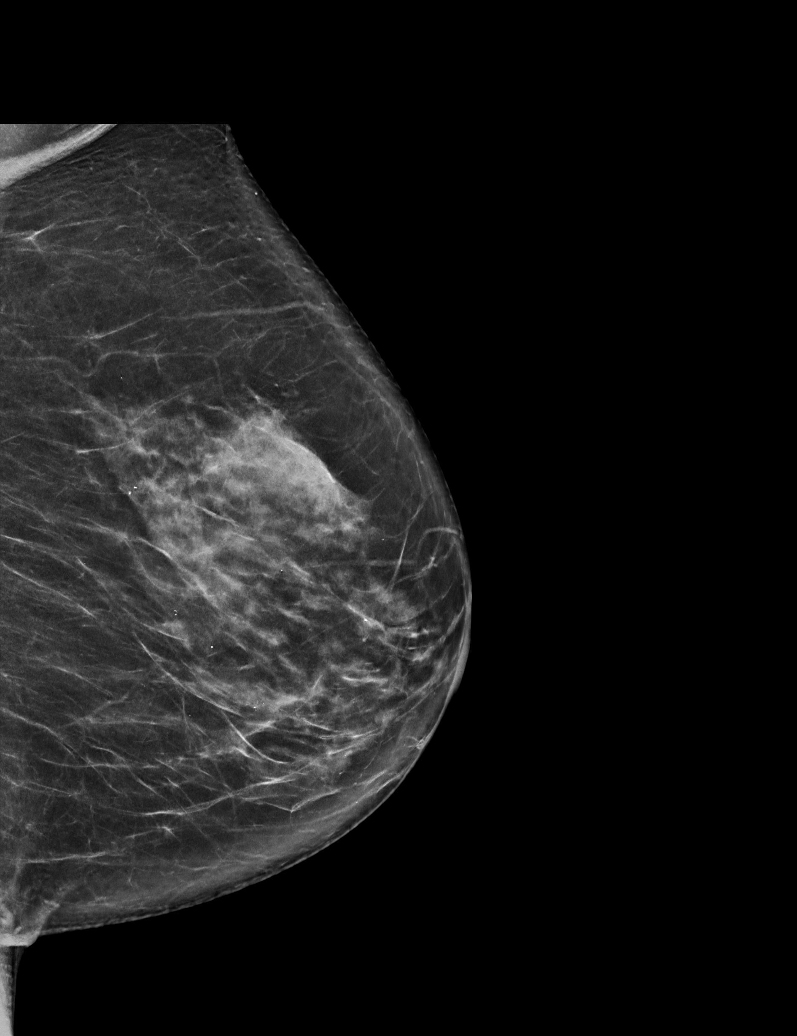

[L CC synth-2D]
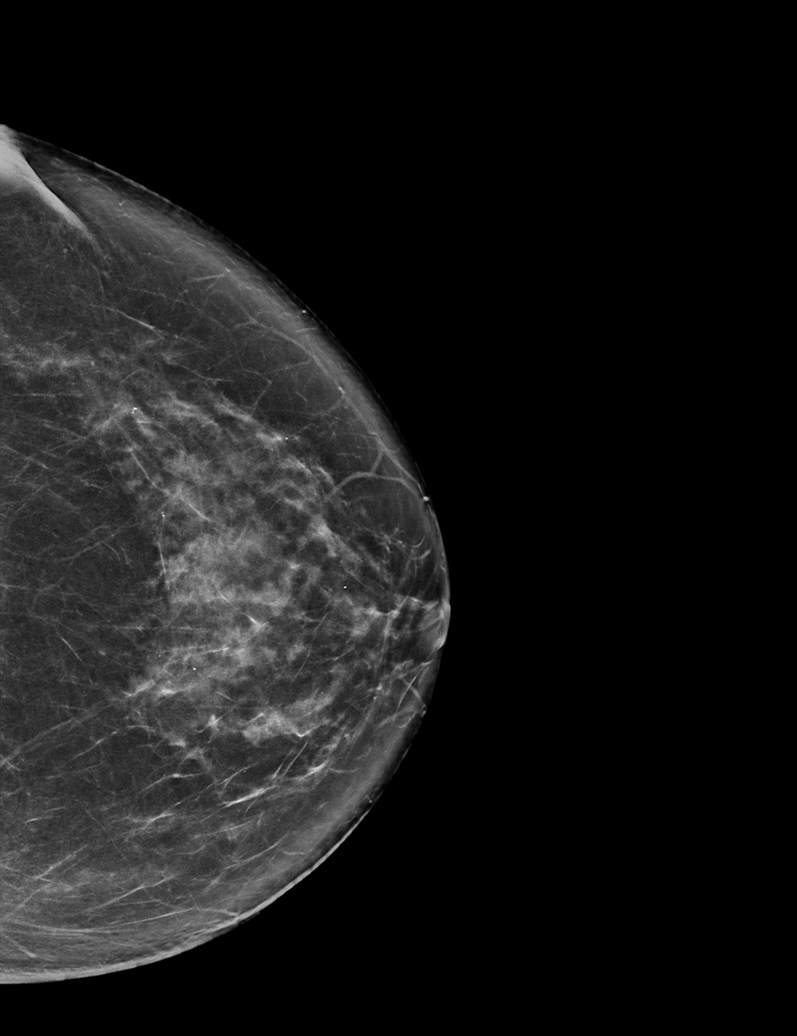

[R MLO synth-2D]
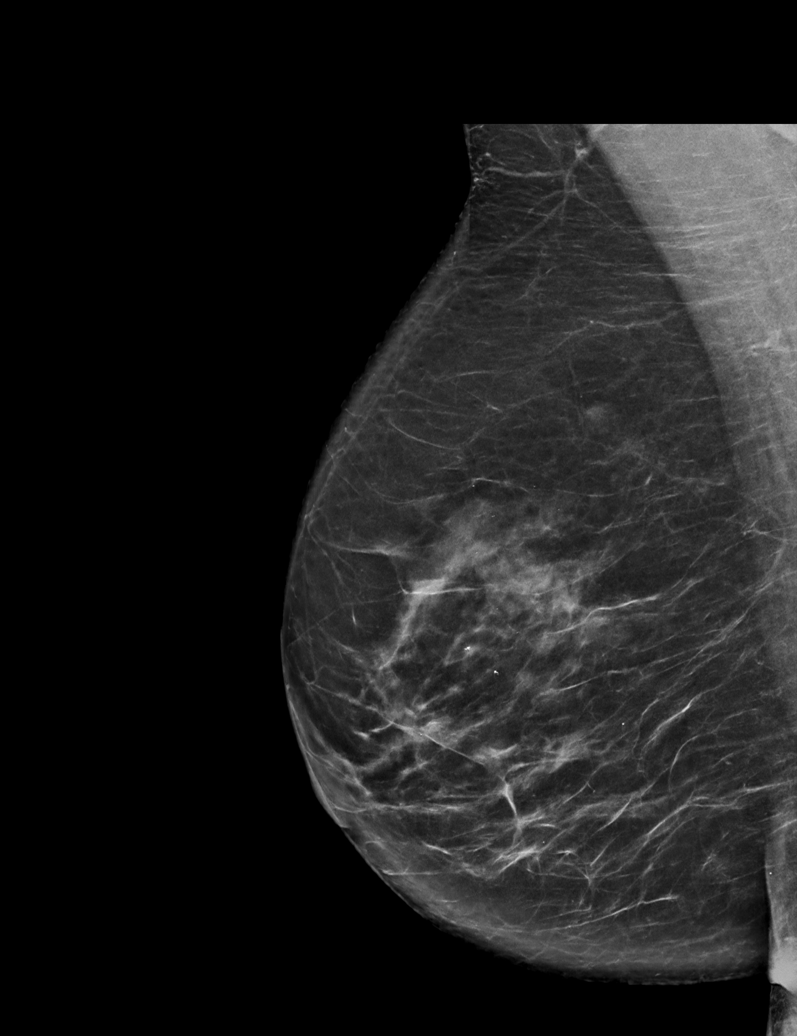

[L MLO tomo · tomo slice 36/71.0]
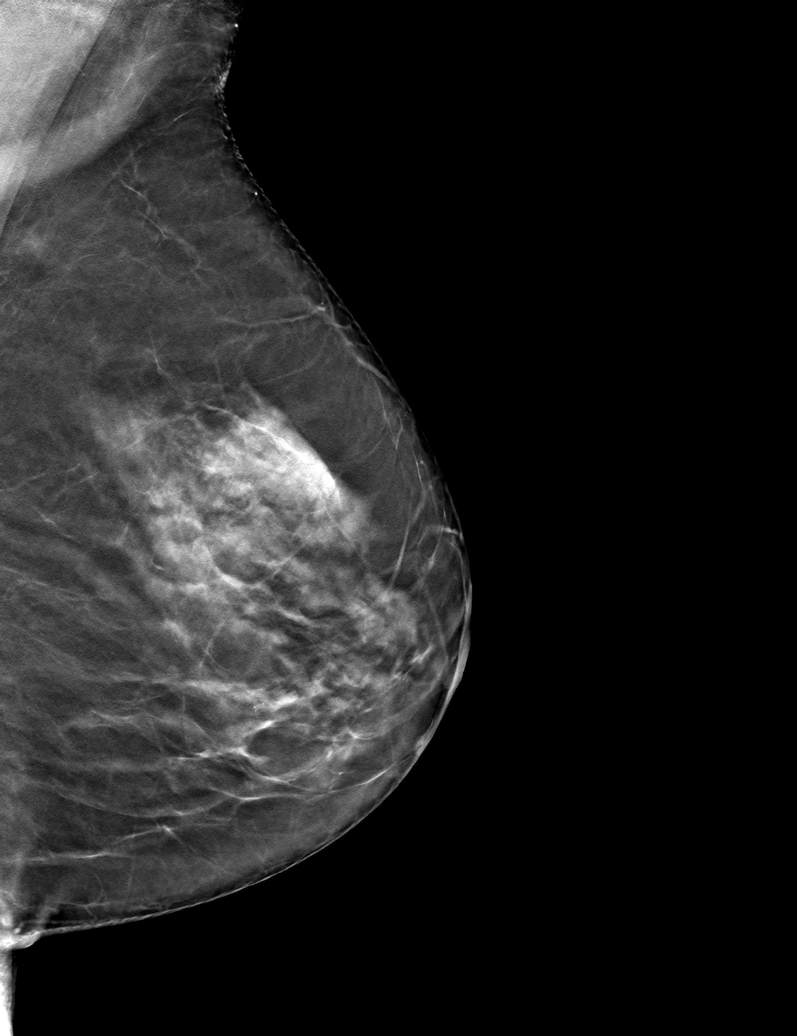

[6 of 30 positions shown; findings below may reference images not displayed]

ACR Breast Density Category c: The breast tissue is heterogeneously
dense, which may obscure small masses.
FINDINGS: There are no findings suspicious for malignancy.
IMPRESSION: No mammographic evidence of malignancy. A result letter of this
screening mammogram will be mailed directly to the patient.

RECOMMENDATION:
Screening mammogram in one year. (Code:Q3-W-BC3)

BI-RADS CATEGORY  1: Negative.

## 2022-01-20 ENCOUNTER — Other Ambulatory Visit: Payer: Self-pay | Admitting: Family Medicine

## 2022-01-20 DIAGNOSIS — Z1231 Encounter for screening mammogram for malignant neoplasm of breast: Secondary | ICD-10-CM

## 2022-03-12 ENCOUNTER — Ambulatory Visit
Admission: RE | Admit: 2022-03-12 | Discharge: 2022-03-12 | Disposition: A | Discharge status: Home / Self Care | Payer: BC Managed Care – PPO | Source: Ambulatory Visit | Attending: Family Medicine | Admitting: Family Medicine

## 2022-03-12 DIAGNOSIS — Z1231 Encounter for screening mammogram for malignant neoplasm of breast: Secondary | ICD-10-CM

## 2023-01-27 ENCOUNTER — Other Ambulatory Visit: Payer: Self-pay | Admitting: Family Medicine

## 2023-01-27 DIAGNOSIS — Z0000 Encounter for general adult medical examination without abnormal findings: Secondary | ICD-10-CM

## 2023-03-15 ENCOUNTER — Ambulatory Visit
Admission: RE | Admit: 2023-03-15 | Discharge: 2023-03-15 | Disposition: A | Discharge status: Home / Self Care | Payer: Medicare PPO | Source: Ambulatory Visit | Attending: Family Medicine | Admitting: Family Medicine

## 2023-03-15 DIAGNOSIS — Z0000 Encounter for general adult medical examination without abnormal findings: Secondary | ICD-10-CM

## 2023-06-29 DIAGNOSIS — H04123 Dry eye syndrome of bilateral lacrimal glands: Secondary | ICD-10-CM | POA: Diagnosis not present

## 2023-06-29 DIAGNOSIS — H52203 Unspecified astigmatism, bilateral: Secondary | ICD-10-CM | POA: Diagnosis not present

## 2023-06-29 DIAGNOSIS — H43813 Vitreous degeneration, bilateral: Secondary | ICD-10-CM | POA: Diagnosis not present

## 2023-06-29 DIAGNOSIS — H524 Presbyopia: Secondary | ICD-10-CM | POA: Diagnosis not present

## 2023-06-29 DIAGNOSIS — H5213 Myopia, bilateral: Secondary | ICD-10-CM | POA: Diagnosis not present

## 2023-06-29 DIAGNOSIS — Z961 Presence of intraocular lens: Secondary | ICD-10-CM | POA: Diagnosis not present

## 2024-02-24 ENCOUNTER — Other Ambulatory Visit: Payer: Self-pay | Admitting: Family Medicine

## 2024-02-24 DIAGNOSIS — Z1231 Encounter for screening mammogram for malignant neoplasm of breast: Secondary | ICD-10-CM

## 2024-03-13 DIAGNOSIS — E7800 Pure hypercholesterolemia, unspecified: Secondary | ICD-10-CM | POA: Diagnosis not present

## 2024-03-13 DIAGNOSIS — Z0000 Encounter for general adult medical examination without abnormal findings: Secondary | ICD-10-CM | POA: Diagnosis not present

## 2024-03-13 DIAGNOSIS — Z1331 Encounter for screening for depression: Secondary | ICD-10-CM | POA: Diagnosis not present

## 2024-03-23 ENCOUNTER — Inpatient Hospital Stay: Admission: RE | Admit: 2024-03-23 | Discharge: 2024-03-23 | Attending: Family Medicine | Admitting: Family Medicine

## 2024-03-23 DIAGNOSIS — Z1231 Encounter for screening mammogram for malignant neoplasm of breast: Secondary | ICD-10-CM
